# Patient Record
Sex: Female | Born: 1990 | Hispanic: Yes | Marital: Single | Smoking: Never smoker
Health system: Southern US, Community
[De-identification: ages and names within clinical notes are randomized; demographics above are authoritative.]

## PROBLEM LIST (undated history)

## (undated) DIAGNOSIS — Z789 Other specified health status: Secondary | ICD-10-CM

## (undated) HISTORY — PX: NO PAST SURGERIES: SHX2092

---

## 2020-01-07 ENCOUNTER — Ambulatory Visit: Payer: Self-pay

## 2021-01-16 ENCOUNTER — Other Ambulatory Visit: Payer: Self-pay

## 2021-01-16 ENCOUNTER — Emergency Department: Payer: Self-pay

## 2021-01-16 ENCOUNTER — Emergency Department
Admission: EM | Admit: 2021-01-16 | Discharge: 2021-01-16 | Disposition: A | Discharge status: Home / Self Care | Payer: Self-pay | Attending: Emergency Medicine | Admitting: Emergency Medicine

## 2021-01-16 DIAGNOSIS — O418X10 Other specified disorders of amniotic fluid and membranes, first trimester, not applicable or unspecified: Secondary | ICD-10-CM | POA: Insufficient documentation

## 2021-01-16 DIAGNOSIS — Z3A01 Less than 8 weeks gestation of pregnancy: Secondary | ICD-10-CM | POA: Insufficient documentation

## 2021-01-16 DIAGNOSIS — O209 Hemorrhage in early pregnancy, unspecified: Secondary | ICD-10-CM

## 2021-01-16 LAB — CBC WITH DIFFERENTIAL/PLATELET
Abs Immature Granulocytes: 0.02 10*3/uL (ref 0.00–0.07)
Basophils Absolute: 0 10*3/uL (ref 0.0–0.1)
Basophils Relative: 0 %
Eosinophils Absolute: 0.1 10*3/uL (ref 0.0–0.5)
Eosinophils Relative: 1 %
HCT: 38.5 % (ref 36.0–46.0)
Hemoglobin: 13.2 g/dL (ref 12.0–15.0)
Immature Granulocytes: 0 %
Lymphocytes Relative: 30 %
Lymphs Abs: 2.2 10*3/uL (ref 0.7–4.0)
MCH: 28.7 pg (ref 26.0–34.0)
MCHC: 34.3 g/dL (ref 30.0–36.0)
MCV: 83.7 fL (ref 80.0–100.0)
Monocytes Absolute: 0.4 10*3/uL (ref 0.1–1.0)
Monocytes Relative: 6 %
Neutro Abs: 4.6 10*3/uL (ref 1.7–7.7)
Neutrophils Relative %: 63 %
Platelets: 320 10*3/uL (ref 150–400)
RBC: 4.6 MIL/uL (ref 3.87–5.11)
RDW: 14.7 % (ref 11.5–15.5)
WBC: 7.3 10*3/uL (ref 4.0–10.5)
nRBC: 0 % (ref 0.0–0.2)

## 2021-01-16 LAB — URINALYSIS, ROUTINE W REFLEX MICROSCOPIC
Bilirubin Urine: NEGATIVE
Glucose, UA: NEGATIVE mg/dL
Ketones, ur: NEGATIVE mg/dL
Nitrite: NEGATIVE
Protein, ur: NEGATIVE mg/dL
Specific Gravity, Urine: 1.006 (ref 1.005–1.030)
pH: 6 (ref 5.0–8.0)

## 2021-01-16 LAB — COMPREHENSIVE METABOLIC PANEL
ALT: 16 U/L (ref 0–44)
AST: 19 U/L (ref 15–41)
Albumin: 4 g/dL (ref 3.5–5.0)
Alkaline Phosphatase: 54 U/L (ref 38–126)
Anion gap: 6 (ref 5–15)
BUN: 10 mg/dL (ref 6–20)
CO2: 21 mmol/L — ABNORMAL LOW (ref 22–32)
Calcium: 9.3 mg/dL (ref 8.9–10.3)
Chloride: 106 mmol/L (ref 98–111)
Creatinine, Ser: 0.72 mg/dL (ref 0.44–1.00)
GFR, Estimated: >60 mL/min (ref >60)
Glucose, Bld: 108 mg/dL — ABNORMAL HIGH (ref 70–99)
Potassium: 3.2 mmol/L — ABNORMAL LOW (ref 3.5–5.1)
Sodium: 133 mmol/L — ABNORMAL LOW (ref 135–145)
Total Bilirubin: 0.7 mg/dL (ref 0.3–1.2)
Total Protein: 7.5 g/dL (ref 6.5–8.1)

## 2021-01-16 LAB — ABO/RH: ABO/RH(D): O POS

## 2021-01-16 LAB — HCG, QUANTITATIVE, PREGNANCY: hCG, Beta Chain, Quant, S: 81875 m[IU]/mL — ABNORMAL HIGH (ref <5)

## 2021-01-16 LAB — POC URINE PREG, ED: Preg Test, Ur: POSITIVE — AB

## 2021-01-16 MED ORDER — PRENATAL VITAMIN 27-0.8 MG PO TABS: 1.0000 | ORAL_TABLET | Freq: Every day | ORAL | 3 refills | Status: AC

## 2021-01-16 NOTE — ED Notes (Signed)
Dc instructions and scripts reviewed with pt using the interpreter line. No questions or concerns at this time. Pt will follow up with obgyn for ultrasound in 1 week.

## 2021-01-16 NOTE — Discharge Instructions (Addendum)
Llame al nmero del obstetra que aparece arriba para programar una cita para la prxima semana. Si no puede obtener uno, llame a su departamento de Chiropodist.  No levantar objetos pesados, tener relaciones sexuales ni nada en la vagina hasta que lo vea un obstetra/gineclogo.  Recomiendo tomar una vitamina prenatal. Puedes comprar esto sin receta

## 2021-01-16 NOTE — ED Triage Notes (Signed)
Pt c/o light vaginal bleeding with abd cramping since last night,.

## 2021-01-16 NOTE — ED Notes (Signed)
Interpreter requested at this time 

## 2021-01-16 NOTE — ED Notes (Signed)
Pt given urine cup.

## 2021-01-16 NOTE — ED Provider Notes (Signed)
[facility] Emergency Department Provider Note  ____________________________________________   Event Date/Time   First MD Initiated Contact with Patient 01/16/21 [phone]     (approximate)  I have reviewed the triage vital signs and the nursing notes.   HISTORY  Chief Complaint Vaginal Bleeding    HPI Sara Melton is a 30 y.o. female here with vaginal bleeding.  The patient is currently about [redacted] weeks pregnant based on her LMP.  She reports that she went to the restroom today and noticed a small amount of liquid blood in the toilet.  She has since had multiple episodes of bleeding in the toilet when she sits to go to the bathroom.  She has not had any bleeding between episodes going to bathroom.  No blood in her underwear or pads.  She has had some mild lower abdominal cramping.  This feels somewhat like..  Denies any dysuria or frequency.  No flank pain.  No nausea or vomiting.  No chills.  She is a G2, P1 with no complications during her first pregnancy.  She has been taking prenatals.    History reviewed. No pertinent past medical history.  There are no problems to display for this patient.   History reviewed. No pertinent surgical history.  Prior to Admission medications   Medication Sig Start Date End Date Taking? Authorizing Provider  Prenatal Vit-Fe Fumarate-FA (PRENATAL VITAMIN) 27-0.8 MG TABS Take 1 tablet by mouth daily. 01/16/21 04/16/21 Yes Shaune Pollack, MD    Allergies Patient has no known allergies.  No family history on file.  Social History Social History   Tobacco Use   Smoking status: Never   Smokeless tobacco: Never  Substance Use Topics   Alcohol use: Not Currently   Drug use: Not Currently    Review of Systems  Review of Systems  Constitutional:  Negative for fatigue and fever.  HENT:  Negative for congestion and sore throat.   Eyes:  Negative for visual disturbance.  Respiratory:  Negative for cough and  shortness of breath.   Cardiovascular:  Negative for chest pain.  Gastrointestinal:  Positive for abdominal pain. Negative for diarrhea, nausea and vomiting.  Genitourinary:  Positive for vaginal bleeding. Negative for flank pain.  Musculoskeletal:  Negative for back pain and neck pain.  Skin:  Negative for rash and wound.  Neurological:  Negative for weakness.  All other systems reviewed and are negative.   ____________________________________________  PHYSICAL EXAM:      VITAL SIGNS: ED Triage Vitals  Enc Vitals Group     BP 01/16/21 0851 112/66     Pulse Rate 01/16/21 0851 81     Resp 01/16/21 0851 15     Temp 01/16/21 0851 97.9 F (36.6 C)     Temp Source 01/16/21 0851 Oral     SpO2 01/16/21 0851 100 %     Weight --      Height --      Head Circumference --      Peak Flow --      Pain Score 01/16/21 0849 4     Pain Loc --      Pain Edu? --      Excl. in GC? --      Physical Exam Vitals and nursing note reviewed.  Constitutional:      General: She is not in acute distress.    Appearance: She is well-developed.  HENT:     Head: Normocephalic and atraumatic.  Eyes:  Conjunctiva/sclera: Conjunctivae normal.  Cardiovascular:     Rate and Rhythm: Normal rate and regular rhythm.     Heart sounds: Normal heart sounds. No murmur heard.   No friction rub.  Pulmonary:     Effort: Pulmonary effort is normal. No respiratory distress.     Breath sounds: Normal breath sounds. No wheezing or rales.  Abdominal:     General: There is no distension.     Palpations: Abdomen is soft.     Tenderness: There is abdominal tenderness (Minimal, suprapubic).  Musculoskeletal:     Cervical back: Neck supple.  Skin:    General: Skin is warm.     Capillary Refill: Capillary refill takes less than 2 seconds.  Neurological:     Mental Status: She is alert and oriented to person, place, and time.     Motor: No abnormal muscle tone.       ____________________________________________   LABS (all labs ordered are listed, but only abnormal results are displayed)  Labs Reviewed  COMPREHENSIVE METABOLIC PANEL - Abnormal; Notable for the following components:      Result Value   Sodium 133 (*)    Potassium 3.2 (*)    CO2 21 (*)    Glucose, Bld 108 (*)    All other components within normal limits  HCG, QUANTITATIVE, PREGNANCY - Abnormal; Notable for the following components:   hCG, Beta Chain, Quant, S 81,875 (*)    All other components within normal limits  URINALYSIS, ROUTINE W REFLEX MICROSCOPIC - Abnormal; Notable for the following components:   Color, Urine YELLOW (*)    APPearance HAZY (*)    Hgb urine dipstick LARGE (*)    Leukocytes,Ua SMALL (*)    Bacteria, UA FEW (*)    All other components within normal limits  POC URINE PREG, ED - Abnormal; Notable for the following components:   Preg Test, Ur POSITIVE (*)    All other components within normal limits  CBC WITH DIFFERENTIAL/PLATELET  ABO/RH    ____________________________________________  EKG: Single viable intrauterine pregnancy at 5 weeks 5 days, moderate subchorionic hemorrhage ________________________________________  RADIOLOGY All imaging, including plain films, CT scans, and ultrasounds, independently reviewed by me, and interpretations confirmed via formal radiology reads.  ED MD interpretation:   Ultrasound:  Official radiology report(s): US OB LESS THAN 14 WEEKS WITH OB TRANSVAGINAL  Result Date: 01/16/2021 CLINICAL DATA:  Vaginal bleeding.  LMP 11/26/2020 EXAM: OBSTETRIC <14 WK [place] AND TRANSVAGINAL OB US TECHNIQUE: Both transabdominal and transvaginal ultrasound examinations were performed for complete evaluation of the gestation as well as the maternal uterus, adnexal regions, and pelvic cul-de-sac. Transvaginal technique was performed to assess early pregnancy. COMPARISON:  None. FINDINGS: Intrauterine gestational sac: Single Yolk sac:   Visualized. Embryo:  Visualized. Cardiac Activity: Visualized. Heart Rate: 101 bpm CRL:  2 mm   5 w   5 d                  [place] EDC: 09/13/2021 Subchorionic hemorrhage: Moderate subchorionic hemorrhage measuring 3.4 x 2.1 x 1.2 cm Maternal uterus/adnexae: No uterine masses. Corpus luteal cyst noted in the right ovary. Normal left ovary. Trace simple free fluid in the pelvic cul-de-sac. IMPRESSION: 1. Single viable intrauterine pregnancy at 5 weeks 5 days. 2. Moderate subchorionic hemorrhage. 3. Trace simple free fluid in the pelvic cul-de-sac. 4. Normal uterus and ovaries. Electronically Signed   By: Sherron Ales M.D.   On: 01/16/2021 12:32    ____________________________________________  PROCEDURES  Procedure(s) performed (including Critical Care):  Procedures  ____________________________________________  INITIAL IMPRESSION / MDM / ASSESSMENT AND PLAN / ED COURSE  As part of my medical decision making, I reviewed the following data within the electronic MEDICAL RECORD NUMBER Nursing notes reviewed and incorporated, Old chart reviewed, Notes from prior ED visits, and [place] Controlled Substance Database       *Sara Melton was evaluated in Emergency Department on 01/16/2021 for the symptoms described in the history of present illness. She was evaluated in the context of the global COVID-19 pandemic, which necessitated consideration that the patient might be at risk for infection with the SARS-CoV-2 virus that causes COVID-19. Institutional protocols and algorithms that pertain to the evaluation of patients at risk for COVID-19 are in a state of rapid change based on information released by regulatory bodies including the CDC and federal and state organizations. These policies and algorithms were followed during the patient's care in the ED.  Some ED evaluations and interventions may be delayed as a result of limited staffing during the pandemic.*     Medical Decision Making: 30 year old female  here with vaginal bleeding.  Patient is currently in her early first trimester.  hCG 81,000.  ABO shows O+, RhoGAM not indicated.  Hemoglobin is stable.  UA with no hematuria and she denies any urinary symptoms.  Ultrasound obtained, reviewed, does show moderate subchorionic hemorrhage.  She has viable IUP at 5 weeks and 5 days.  No evidence of ectopic.  I discussed the findings with her, including slightly increased risk for miscarriage given moderate size of the subchorionic.  Will advise pelvic rest, prenatals, and refer her to [facility] for follow-up.  An interpreter was used and all questions answered.  Discharged with outpatient follow-up.  ____________________________________________  FINAL CLINICAL IMPRESSION(S) / ED DIAGNOSES  Final diagnoses:  First trimester bleeding  Subchorionic hematoma in first trimester, single or unspecified fetus     MEDICATIONS GIVEN DURING THIS VISIT:  Medications - No data to display   ED Discharge Orders          Ordered    Prenatal Vit-Fe Fumarate-FA (PRENATAL VITAMIN) 27-0.8 MG TABS  Daily        01/16/21 1317             Note:  This document was prepared using Dragon voice recognition software and may include unintentional dictation errors.   Shaune Pollack, MD 01/16/21 [phone]

## 2021-03-24 NOTE — L&D Delivery Note (Signed)
Delivery Note CNM arrived in room at 1030. Pt pushing involuntarily w/ next contraction. Ant li/+1. Pushing well. ROM x 3 hours. At 10:52 AM a viable and healthy, vigorous female was delivered via Vaginal, Spontaneous (Presentation: Left Occiput Anterior).  Shoulders delivered easily. Infant placed skin-to-skin w/ mom. Delayed cord clamping x 1 minute. Cord clamped x 2 and cut by aunt. APGAR: 9, 9; weight  pending. Pitocin bolus started at 1055.     Placenta status: Spontaneous, Intact.  Cord: 3 vessels with the following complications: None.  Cord pH: NA  Anesthesia: None Episiotomy: None Lacerations: 1st degree Suture Repair:  NA. Hemostatic. Est. Blood Loss (mL): 504  Mom to postpartum.  Baby to Couplet care / Skin to Skin. Placenta to: L&D Feeding: Br/Bo Circ: No Contraception: OP Nexplanon  [place] 09/08/2021, 11:30 AM

## 2021-05-09 LAB — OB RESULTS CONSOLE HEPATITIS B SURFACE ANTIGEN: Hepatitis B Surface Ag: NEGATIVE

## 2021-05-09 LAB — OB RESULTS CONSOLE RUBELLA ANTIBODY, IGM: Rubella: IMMUNE

## 2021-05-09 LAB — HEPATITIS C ANTIBODY: HCV Ab: NEGATIVE

## 2021-05-09 LAB — OB RESULTS CONSOLE RPR: RPR: NONREACTIVE

## 2021-05-13 ENCOUNTER — Other Ambulatory Visit: Payer: Self-pay | Admitting: Family

## 2021-05-13 DIAGNOSIS — N6320 Unspecified lump in the left breast, unspecified quadrant: Secondary | ICD-10-CM

## 2021-05-30 ENCOUNTER — Observation Stay
Admission: EM | Admit: 2021-05-30 | Discharge: 2021-05-30 | Disposition: A | Discharge status: Home / Self Care | Payer: BC Managed Care – PPO | Attending: Obstetrics and Gynecology | Admitting: Obstetrics and Gynecology

## 2021-05-30 ENCOUNTER — Other Ambulatory Visit: Payer: Self-pay

## 2021-05-30 DIAGNOSIS — Z3490 Encounter for supervision of normal pregnancy, unspecified, unspecified trimester: Secondary | ICD-10-CM

## 2021-05-30 DIAGNOSIS — R55 Syncope and collapse: Secondary | ICD-10-CM | POA: Insufficient documentation

## 2021-05-30 DIAGNOSIS — Z3A25 25 weeks gestation of pregnancy: Secondary | ICD-10-CM | POA: Insufficient documentation

## 2021-05-30 DIAGNOSIS — O26892 Other specified pregnancy related conditions, second trimester: Principal | ICD-10-CM | POA: Insufficient documentation

## 2021-05-30 MED ORDER — ONDANSETRON 4 MG PO TBDP: 4.0000 mg | ORAL_TABLET | Freq: Once | ORAL | Status: AC

## 2021-05-30 MED ORDER — ACETAMINOPHEN 325 MG PO TABS: 650.0000 mg | ORAL_TABLET | Freq: Four times a day (QID) | ORAL | Status: DC | PRN

## 2021-05-30 MED ORDER — ACETAMINOPHEN 325 MG PO TABS
ORAL_TABLET | ORAL | Status: AC
  Administered 2021-05-30: 04:00:00 650 mg via ORAL
  Filled 2021-05-30: qty 2

## 2021-05-30 MED ORDER — ONDANSETRON 4 MG PO TBDP
ORAL_TABLET | ORAL | Status: AC
  Administered 2021-05-30: 04:00:00 4 mg via ORAL
  Filled 2021-05-30: qty 1

## 2021-05-30 NOTE — OB Triage Note (Signed)
Pt discharged home in stable condition per MD order.  ?

## 2021-05-30 NOTE — Progress Notes (Signed)
Pt states she has not had any cramping since arriving to L&D. ?

## 2021-05-30 NOTE — OB Triage Note (Signed)
Pt is a G2P1 at [redacted]w[redacted]d presenting to L&D triage c/o of passing out at work. Pt also endorses some mild cramping rating it 5/10. Pt denies LOF and vaginal bleeding at this time. Pt endorses spotting on Saturday 05/26/2021. +FM. FHT 140.  ?

## 2021-05-31 ENCOUNTER — Ambulatory Visit
Admission: RE | Admit: 2021-05-31 | Discharge: 2021-05-31 | Disposition: A | Discharge status: Home / Self Care | Payer: No Typology Code available for payment source | Source: Ambulatory Visit | Attending: Family | Admitting: Family

## 2021-05-31 DIAGNOSIS — N6320 Unspecified lump in the left breast, unspecified quadrant: Secondary | ICD-10-CM

## 2021-05-31 DIAGNOSIS — R55 Syncope and collapse: Secondary | ICD-10-CM | POA: Diagnosis present

## 2021-05-31 NOTE — Final Progress Note (Signed)
.  L&D OB Triage Note ? ?Sara Melton is an unassigned 31 y.o. G2P1001 female at [redacted]w[redacted]d, EDD Estimated Date of Delivery: 09/13/21 who presented to triage for complaints of syncopal episode at work.  She reports that she did not sustain abdominal trauma, however did endorse mild cramping 5/10 earlier that day. Denies vaginal bleeding, leaking of fluids or contractions.  Of note, patient reported that she had not eaten much today.  She was evaluated by the nurses with no significant findings.  No cramping noted while in triage. Vital signs stable. An NST was performed and has been reviewed by MD. She was treated with PO hydration and crackers.  ? ?NST INTERPRETATION: ?Indications: patient reassurance and maternal syncopal episode ? ?Mode: External ?Baseline Rate (A): 145 bpm ?Variability: Moderate ?Accelerations: 10 x 10 ?Decelerations: None ?  ?  ?Contraction Frequency (min): None noted ? ?Impression: reactive ? ? ? ?Plan: NST performed was reviewed and was found to be reactive. She was discharged home with syncopal precautions.  Encouraged to regularly consume meals, and encouraged snacking and maintaining adequate hydration. Continue routine prenatal care. Follow up with her OB/GYN as previously scheduled.  ? ? ? ?Hildred Laser, MD ?[facility] ? ?

## 2021-07-04 LAB — OB RESULTS CONSOLE HIV ANTIBODY (ROUTINE TESTING): HIV: NONREACTIVE

## 2021-08-20 LAB — OB RESULTS CONSOLE GC/CHLAMYDIA
Chlamydia: NEGATIVE
Neisseria Gonorrhea: NEGATIVE

## 2021-08-20 LAB — OB RESULTS CONSOLE GBS: GBS: NEGATIVE

## 2021-09-08 ENCOUNTER — Other Ambulatory Visit: Payer: Self-pay

## 2021-09-08 ENCOUNTER — Encounter (HOSPITAL_COMMUNITY): Payer: Self-pay | Admitting: Obstetrics & Gynecology

## 2021-09-08 ENCOUNTER — Inpatient Hospital Stay (HOSPITAL_COMMUNITY)
Admission: AD | Admit: 2021-09-08 | Discharge: 2021-09-09 | DRG: 807 | Disposition: A | Discharge status: Home / Self Care | Payer: BC Managed Care – PPO | Attending: Obstetrics & Gynecology | Admitting: Obstetrics & Gynecology

## 2021-09-08 DIAGNOSIS — O26893 Other specified pregnancy related conditions, third trimester: Secondary | ICD-10-CM | POA: Diagnosis present

## 2021-09-08 DIAGNOSIS — Z3A39 39 weeks gestation of pregnancy: Secondary | ICD-10-CM | POA: Diagnosis not present

## 2021-09-08 HISTORY — DX: Other specified health status: Z78.9

## 2021-09-08 LAB — CBC
HCT: 34.9 % — ABNORMAL LOW (ref 36.0–46.0)
Hemoglobin: 11.2 g/dL — ABNORMAL LOW (ref 12.0–15.0)
MCH: 26.7 pg (ref 26.0–34.0)
MCHC: 32.1 g/dL (ref 30.0–36.0)
MCV: 83.3 fL (ref 80.0–100.0)
Platelets: 219 10*3/uL (ref 150–400)
RBC: 4.19 MIL/uL (ref 3.87–5.11)
RDW: 16 % — ABNORMAL HIGH (ref 11.5–15.5)
WBC: 9.4 10*3/uL (ref 4.0–10.5)
nRBC: 0.4 % — ABNORMAL HIGH (ref 0.0–0.2)

## 2021-09-08 LAB — COMPREHENSIVE METABOLIC PANEL
ALT: 16 U/L (ref 0–44)
AST: 22 U/L (ref 15–41)
Albumin: 2.8 g/dL — ABNORMAL LOW (ref 3.5–5.0)
Alkaline Phosphatase: 268 U/L — ABNORMAL HIGH (ref 38–126)
Anion gap: 8 (ref 5–15)
BUN: 12 mg/dL (ref 6–20)
CO2: 16 mmol/L — ABNORMAL LOW (ref 22–32)
Calcium: 8.9 mg/dL (ref 8.9–10.3)
Chloride: 111 mmol/L (ref 98–111)
Creatinine, Ser: 0.71 mg/dL (ref 0.44–1.00)
GFR, Estimated: >60 mL/min (ref >60)
Glucose, Bld: 87 mg/dL (ref 70–99)
Potassium: 4.1 mmol/L (ref 3.5–5.1)
Sodium: 135 mmol/L (ref 135–145)
Total Bilirubin: 0.3 mg/dL (ref 0.3–1.2)
Total Protein: 6.9 g/dL (ref 6.5–8.1)

## 2021-09-08 LAB — TYPE AND SCREEN
ABO/RH(D): O POS
Antibody Screen: NEGATIVE

## 2021-09-08 LAB — RPR: RPR Ser Ql: NONREACTIVE

## 2021-09-08 MED ORDER — ACETAMINOPHEN 325 MG PO TABS
650.0000 mg | ORAL_TABLET | ORAL | Status: DC | PRN
  Administered 2021-09-08 – 2021-09-09 (×2): 650 mg via ORAL
  Filled 2021-09-08 (×2): qty 2

## 2021-09-08 MED ORDER — OXYTOCIN BOLUS FROM INFUSION
333.0000 mL | Freq: Once | INTRAVENOUS | Status: AC
  Administered 2021-09-08: 333 mL via INTRAVENOUS

## 2021-09-08 MED ORDER — SIMETHICONE 80 MG PO CHEW: 80.0000 mg | CHEWABLE_TABLET | ORAL | Status: DC | PRN

## 2021-09-08 MED ORDER — PRENATAL MULTIVITAMIN CH
1.0000 | ORAL_TABLET | Freq: Every day | ORAL | Status: DC
  Administered 2021-09-08 – 2021-09-09 (×2): 1 via ORAL
  Filled 2021-09-08 (×2): qty 1

## 2021-09-08 MED ORDER — WITCH HAZEL-GLYCERIN EX PADS: 1.0000 | MEDICATED_PAD | CUTANEOUS | Status: DC | PRN

## 2021-09-08 MED ORDER — IBUPROFEN 600 MG PO TABS
600.0000 mg | ORAL_TABLET | Freq: Four times a day (QID) | ORAL | Status: DC
  Administered 2021-09-08 – 2021-09-09 (×5): 600 mg via ORAL
  Filled 2021-09-08 (×5): qty 1

## 2021-09-08 MED ORDER — OXYTOCIN-SODIUM CHLORIDE 30-0.9 UT/500ML-% IV SOLN
2.5000 [IU]/h | INTRAVENOUS | Status: DC
  Administered 2021-09-08: 2.5 [IU]/h via INTRAVENOUS
  Filled 2021-09-08: qty 500

## 2021-09-08 MED ORDER — LACTATED RINGERS IV SOLN: INTRAVENOUS | Status: DC

## 2021-09-08 MED ORDER — BENZOCAINE-MENTHOL 20-0.5 % EX AERO
1.0000 | INHALATION_SPRAY | CUTANEOUS | Status: DC | PRN
  Administered 2021-09-08: 1 via TOPICAL
  Filled 2021-09-08 (×2): qty 56

## 2021-09-08 MED ORDER — ONDANSETRON HCL 4 MG/2ML IJ SOLN: 4.0000 mg | Freq: Four times a day (QID) | INTRAMUSCULAR | Status: DC | PRN

## 2021-09-08 MED ORDER — LACTATED RINGERS IV SOLN: 500.0000 mL | INTRAVENOUS | Status: DC | PRN

## 2021-09-08 MED ORDER — TETANUS-DIPHTH-ACELL PERTUSSIS 5-2.5-18.5 LF-MCG/0.5 IM SUSY: 0.5000 mL | PREFILLED_SYRINGE | Freq: Once | INTRAMUSCULAR | Status: DC

## 2021-09-08 MED ORDER — SOD CITRATE-CITRIC ACID 500-334 MG/5ML PO SOLN: 30.0000 mL | ORAL | Status: DC | PRN

## 2021-09-08 MED ORDER — OXYCODONE-ACETAMINOPHEN 5-325 MG PO TABS
1.0000 | ORAL_TABLET | ORAL | Status: DC | PRN
  Administered 2021-09-08: 1 via ORAL
  Filled 2021-09-08: qty 1

## 2021-09-08 MED ORDER — ONDANSETRON HCL 4 MG PO TABS: 4.0000 mg | ORAL_TABLET | ORAL | Status: DC | PRN

## 2021-09-08 MED ORDER — MEASLES, MUMPS & RUBELLA VAC IJ SOLR: 0.5000 mL | Freq: Once | INTRAMUSCULAR | Status: DC

## 2021-09-08 MED ORDER — OXYCODONE-ACETAMINOPHEN 5-325 MG PO TABS: 2.0000 | ORAL_TABLET | ORAL | Status: DC | PRN

## 2021-09-08 MED ORDER — MAGNESIUM HYDROXIDE 400 MG/5ML PO SUSP: 30.0000 mL | ORAL | Status: DC | PRN

## 2021-09-08 MED ORDER — LIDOCAINE HCL (PF) 1 % IJ SOLN: 30.0000 mL | INTRAMUSCULAR | Status: DC | PRN

## 2021-09-08 MED ORDER — COCONUT OIL OIL: 1.0000 | TOPICAL_OIL | Status: DC | PRN

## 2021-09-08 MED ORDER — OXYCODONE-ACETAMINOPHEN 5-325 MG PO TABS: 1.0000 | ORAL_TABLET | ORAL | Status: DC | PRN

## 2021-09-08 MED ORDER — ACETAMINOPHEN 325 MG PO TABS: 650.0000 mg | ORAL_TABLET | ORAL | Status: DC | PRN

## 2021-09-08 MED ORDER — DIBUCAINE (PERIANAL) 1 % EX OINT: 1.0000 | TOPICAL_OINTMENT | CUTANEOUS | Status: DC | PRN

## 2021-09-08 MED ORDER — ONDANSETRON HCL 4 MG/2ML IJ SOLN: 4.0000 mg | INTRAMUSCULAR | Status: DC | PRN

## 2021-09-08 MED ORDER — DIPHENHYDRAMINE HCL 25 MG PO CAPS: 25.0000 mg | ORAL_CAPSULE | Freq: Four times a day (QID) | ORAL | Status: DC | PRN

## 2021-09-08 NOTE — Lactation Note (Signed)
This note was copied from a baby's chart. Lactation Consultation Note  Patient Name: Sara Melton [id number] Date: 09/08/2021 Reason for consult: Initial assessment;Term Age:30 hours   P2 mother whose infant is now 18 hours old.  This is a term baby at 39+2 weeks.  Mother's current feeding preference is breast/formula. Mother breast fed her first child (now 37 years old) for 2 years.  In house Spanish interpreter, Racquel, used for interpretation.    Baby "Sara Melton" was swaddled and asleep next to mother when I arrived.  Mother reported having a good feeding after birth.  Breast feeding basics reviewed.  Encouraged lots of STS, breast massage and hand expression.  Suggested mother feed back any expressed drops she obtains via finger/spoon.  Mother may call for latch assistance as desired.  She will feed 8-12 times/24 hours or sooner if "Sara Melton" shows cues.  Mother had no questions/concerns at this time.  Father present and asleep on the couch.   Maternal Data Has patient been taught Hand Expression?: Yes Does the patient have breastfeeding experience prior to this delivery?: Yes How long did the patient breastfeed?: 2 years  Feeding Mother's Current Feeding Choice: Breast Milk and Formula  LATCH Score                    Lactation Tools Discussed/Used    Interventions Interventions: Breast feeding basics reviewed;Education;LC Services brochure (Spanish brochure)  Discharge WIC Program: Yes  Consult Status Consult Status: Follow-up Date: 09/09/21 Follow-up type: In-patient    Dora Sims 09/08/2021, 4:24 PM

## 2021-09-08 NOTE — Lactation Note (Signed)
This note was copied from a baby's chart. Lactation Consultation Note  Patient Name: Sara Melton [id number] Date: 09/08/2021 Reason for consult: L&D Initial assessment;Term;Breastfeeding assistance;Other (Comment) (LC LD visit at PP, [facility] spanish interpreter [place] present. Dad holding baby and LD RN just bringing baby back from the bathroom. LC offered to assist to latch / mom receptive, Baby latched on/ off at 1st,then fed 5 mins. relatched, still feeding.) Age: 29 mins PP / P2 ( exp BF 2 years )  Baby still rooting after release and latched back with depth, wide open mouth, flanged lips and swallows with breast compressions. Still feeding > 5 mins ( 2nd latch ) per mom comfortable.  Maternal Data Does the patient have breastfeeding experience prior to this delivery?: Yes How long did the patient breastfeed?: per mom 2 years with her 1st baby ( now 34 years old )  Feeding Mother's Current Feeding Choice: Breast Milk and Formula  LATCH Score Latch: Repeated attempts needed to sustain latch, nipple held in mouth throughout feeding, stimulation needed to elicit sucking reflex.  Audible Swallowing: A few with stimulation  Type of Nipple: Everted at rest and after stimulation  Comfort (Breast/Nipple): Soft / non-tender  Hold (Positioning): Assistance needed to correctly position infant at breast and maintain latch.  LATCH Score: 7   Lactation Tools Discussed/Used    Interventions Interventions: Breast feeding basics reviewed;Assisted with latch;Skin to skin;Breast compression;Adjust position;Support pillows;Education  Discharge    Consult Status Consult Status: Follow-up from L&D Date: 09/08/21 Follow-up type: In-patient    Matilde Sprang Yvonnia Tango 09/08/2021, 11:57 AM

## 2021-09-08 NOTE — MAU Note (Signed)
Vertex presentation confirmed via bs [place] by cnm leftwich kirby

## 2021-09-08 NOTE — Discharge Summary (Signed)
Postpartum Discharge Summary  Date of Service updated***     Patient Name: Sara Melton DOB: October 15, 1990 MRN: [id number]  Date of admission: 09/08/2021 Delivery date:09/08/2021  Delivering provider: Patriciaann Clan  Date of discharge: 09/08/2021  Admitting diagnosis: Indication for care or intervention in labor or delivery [O75.9] Intrauterine pregnancy: [redacted]w[redacted]d     Secondary diagnosis:  Principal Problem:   Vaginal delivery Active Problems:   Indication for care or intervention in labor or delivery  Additional problems: ***    Discharge diagnosis: Term Pregnancy Delivered                                              Post partum procedures:{Postpartum procedures:23558} Augmentation:  None Complications: None  Hospital course: Onset of Labor With Vaginal Delivery      31 y.o. yo G2P1001 at [redacted]w[redacted]d was admitted in Active Labor on 09/08/2021. Patient had an uncomplicated labor course as follows:  Membrane Rupture Time/Date: 7:50 AM ,09/08/2021   Delivery Method:Vaginal, Spontaneous  Episiotomy: None  Lacerations:  1st degree  Patient had an uncomplicated postpartum course.  She is ambulating, tolerating a regular diet, passing flatus, and urinating well. Patient is discharged home in stable condition on 09/08/21.  Newborn Data: Birth date:09/08/2021  Birth time:10:52 AM  Gender:Female  Living status:Living  Apgars:9 ,9  Weight:   Magnesium Sulfate received: No BMZ received: No Rhophylac:N/A MMR:N/A T-DaP:Given prenatally Flu: {FBP:10258} Transfusion:{Transfusion received:30440034}  Physical exam  Vitals:   09/08/21 1100 09/08/21 1115 09/08/21 1130 09/08/21 1145  BP: 116/66 120/69 127/78 (!) 115/99  Pulse: 72 71 70 72  Resp: $Remo'18 18 16 16  'roBiC$ Temp:  98 F (36.7 C)    TempSrc:  Oral    SpO2:      Weight:      Height:       General: {Exam; general:21111117} Lochia: {Desc; appropriate/inappropriate:30686::"appropriate"} Uterine Fundus: {Desc;  firm/soft:30687} Incision: {Exam; incision:21111123} DVT Evaluation: {Exam; dvt:2111122} Labs: Lab Results  Component Value Date   WBC 9.4 09/08/2021   HGB 11.2 (L) 09/08/2021   HCT 34.9 (L) 09/08/2021   MCV 83.3 09/08/2021   PLT 219 09/08/2021      Latest Ref Rng & Units 09/08/2021    7:40 AM  CMP  Glucose 70 - 99 mg/dL 87   BUN 6 - 20 mg/dL 12   Creatinine 0.44 - 1.00 mg/dL 0.71   Sodium 135 - 145 mmol/L 135   Potassium 3.5 - 5.1 mmol/L 4.1   Chloride 98 - 111 mmol/L 111   CO2 22 - 32 mmol/L 16   Calcium 8.9 - 10.3 mg/dL 8.9   Total Protein 6.5 - 8.1 g/dL 6.9   Total Bilirubin 0.3 - 1.2 mg/dL 0.3   Alkaline Phos 38 - 126 U/L 268   AST 15 - 41 U/L 22   ALT 0 - 44 U/L 16    Edinburgh Score:     No data to display           After visit meds:  Allergies as of 09/08/2021   No Known Allergies   Med Rec must be completed prior to using this [facility]***        Discharge home in stable condition Infant Feeding: Bottle and Breast Infant Disposition:{CHL IP OB HOME WITH NIDPOE:42353} Discharge instruction: per After Visit Summary and Postpartum booklet. Activity: Advance as tolerated.  Pelvic rest for 6 weeks.  Diet: {OB EXBM:84132440} Future Appointments:No future appointments. Follow up Visit:   Please schedule this patient for a In person postpartum visit in 6 weeks with the following provider: Any provider. Additional Postpartum F/U:{PP Procedure:23957}  Low risk pregnancy complicated by:  Nothing Delivery mode:  Vaginal, Spontaneous  Anticipated Birth Control:  Nexplanon OP at HD   09/08/2021 Manya Silvas, [place]ain

## 2021-09-08 NOTE — H&P (Signed)
Sara Melton is a 31 y.o. female G2P1001 with IUP at [redacted]w[redacted]d by early [place] presenting for SOL.  She reports positive fetal movement. She denies leakage of fluid or vaginal bleeding.  Prenatal History/Complications: PNC at [facility] Pregnancy complications:  - Past Medical History: Past Medical History:  Diagnosis Date   Medical history non-contributory     Past Surgical History: Past Surgical History:  Procedure Laterality Date   NO PAST SURGERIES      Obstetrical History: OB History     Gravida  2   Para  1   Term  1   Preterm      AB      Living  1      SAB      IAB      Ectopic      Multiple      Live Births  1          Social History: Social History   Socioeconomic History   Marital status: Single    Spouse name: Not on file   Number of children: 1   Years of education: Not on file   Highest education level: Not on file  Occupational History   Not on file  Tobacco Use   Smoking status: Never   Smokeless tobacco: Never  Vaping Use   Vaping Use: Never used  Substance and Sexual Activity   Alcohol use: Not Currently   Drug use: Not Currently   Sexual activity: Yes  Other Topics Concern   Not on file  Social History Narrative   Not on file   Social Determinants of Health   Financial Resource Strain: Not on file  Food Insecurity: Not on file  Transportation Needs: Not on file  Physical Activity: Not on file  Stress: Not on file  Social Connections: Not on file    Family History: History reviewed. No pertinent family history.  Allergies: No Known Allergies  Medications Prior to Admission  Medication Sig Dispense Refill Last Dose   Prenatal Vit-Fe Fumarate-FA (PRENATAL MULTIVITAMIN) TABS tablet Take 1 tablet by mouth daily at 12 noon.   09/07/2021   Review of Systems: Pertinent positives as noted in HPI, otherwise negative.  Blood pressure 124/90, pulse 63, temperature 98.4 F (36.9 C), temperature source Oral, resp. rate 19,  height 5' 2.21" (1.58 m), weight 81.6 kg, last menstrual period 12/07/2020, SpO2 99 %.  General appearance: alert, cooperative, and no distress Lungs: normal respiratory effort Heart: normal rate, warm and well perfused  Abdomen: soft, non-tender, gravid  Extremities: no LE edema or calf tenderness to palpation   Presentation: Cephalic per RN Fetal monitoring: Baseline 140 bpm, moderate variability, + accels, no decels  Uterine activity: Every 1-6 minutes  Dilation: 6.5 Effacement (%): 60 Station: -2 Exam by:: Marvel Plan RN  Prenatal labs: ABO, Rh: --/--/O POS Performed at [facility], [address] [place], [place] [address]  [phone]) Antibody: Negative  Rubella: Immune  RPR: Non-reactive  HBsAg: Non-reactive  HIV: Non-reactive GBS: Negative  1 hr Glucola normal  Genetic screening - LR NIPS, Horizon negative  Anatomy US normal   Prenatal Transfer Tool  Maternal Diabetes: No Genetic Screening: Normal Maternal Ultrasounds/Referrals: Normal Fetal Ultrasounds or other Referrals: None Maternal Substance Abuse: No Significant Maternal Medications:  None Significant Maternal Lab Results: Group B Strep negative  No results found for this or any previous visit (from the past 24 hour(s)).  Assessment: Sara Melton is a 31 y.o. G2P1001  with an IUP at [redacted]w[redacted]d presenting for SOL.   Plan: #Labor: Expectant management #Pain:  Per request #FWB Cat 1 #ID: GBS: Negative  #MOF:  Breast/Bottle #MOC: Nexplanon inpatient #Circ: None  Worthy Rancher 09/08/2021, 7:42 AM

## 2021-09-08 NOTE — MAU Note (Signed)
Sara Melton is a 31 y.o. at [redacted]w[redacted]d here in MAU reporting: ctx that started around 0200 about 8 minutes apart. Reports bloody discharge yesterday, but denies any current VB. Denies LOF. +FM. No recent SVE.   Onset of complaint: 0200 Pain score: 7 Vitals:   09/08/21 0615  BP: 117/85  Pulse: 72  Resp: 19  Temp: 98.4 F (36.9 C)  SpO2: 98%    FHT:149 Lab orders placed from triage: none

## 2021-09-09 ENCOUNTER — Encounter (HOSPITAL_COMMUNITY): Payer: Self-pay | Admitting: Student

## 2021-09-09 LAB — CBC
HCT: 28.4 % — ABNORMAL LOW (ref 36.0–46.0)
Hemoglobin: 9.3 g/dL — ABNORMAL LOW (ref 12.0–15.0)
MCH: 27.2 pg (ref 26.0–34.0)
MCHC: 32.7 g/dL (ref 30.0–36.0)
MCV: 83 fL (ref 80.0–100.0)
Platelets: 202 10*3/uL (ref 150–400)
RBC: 3.42 MIL/uL — ABNORMAL LOW (ref 3.87–5.11)
RDW: 16 % — ABNORMAL HIGH (ref 11.5–15.5)
WBC: 15.1 10*3/uL — ABNORMAL HIGH (ref 4.0–10.5)
nRBC: 0 % (ref 0.0–0.2)

## 2021-09-09 LAB — BIRTH TISSUE RECOVERY COLLECTION (PLACENTA DONATION)

## 2021-09-09 MED ORDER — IBUPROFEN 600 MG PO TABS: 600.0000 mg | ORAL_TABLET | Freq: Four times a day (QID) | ORAL | 0 refills | Status: AC

## 2021-09-09 MED ORDER — FERROUS SULFATE 325 (65 FE) MG PO TBEC: 325.0000 mg | DELAYED_RELEASE_TABLET | ORAL | 2 refills | Status: AC

## 2021-09-09 MED ORDER — ACETAMINOPHEN 325 MG PO TABS: 650.0000 mg | ORAL_TABLET | ORAL | 0 refills | Status: AC | PRN

## 2021-09-17 ENCOUNTER — Telehealth (HOSPITAL_COMMUNITY): Payer: Self-pay

## 2021-09-17 NOTE — Telephone Encounter (Signed)
No answer. Left message to return nurse call.  Marcelino Duster [facility] 06/27//2023,1549

## 2022-10-15 ENCOUNTER — Emergency Department
Admission: EM | Admit: 2022-10-15 | Discharge: 2022-10-15 | Disposition: A | Discharge status: Home / Self Care | Payer: No Typology Code available for payment source | Attending: Emergency Medicine | Admitting: Emergency Medicine

## 2022-10-15 ENCOUNTER — Other Ambulatory Visit: Payer: Self-pay

## 2022-10-15 DIAGNOSIS — M545 Low back pain, unspecified: Secondary | ICD-10-CM | POA: Insufficient documentation

## 2022-10-15 DIAGNOSIS — E876 Hypokalemia: Secondary | ICD-10-CM | POA: Insufficient documentation

## 2022-10-15 DIAGNOSIS — Z1152 Encounter for screening for COVID-19: Secondary | ICD-10-CM | POA: Insufficient documentation

## 2022-10-15 LAB — CBC WITH DIFFERENTIAL/PLATELET
Abs Immature Granulocytes: 0.03 10*3/uL (ref 0.00–0.07)
Basophils Absolute: 0 10*3/uL (ref 0.0–0.1)
Basophils Relative: 0 %
Eosinophils Absolute: 0 10*3/uL (ref 0.0–0.5)
Eosinophils Relative: 0 %
HCT: 28.8 % — ABNORMAL LOW (ref 36.0–46.0)
Hemoglobin: 9.3 g/dL — ABNORMAL LOW (ref 12.0–15.0)
Immature Granulocytes: 0 %
Lymphocytes Relative: 21 %
Lymphs Abs: 2.2 10*3/uL (ref 0.7–4.0)
MCH: 24.4 pg — ABNORMAL LOW (ref 26.0–34.0)
MCHC: 32.3 g/dL (ref 30.0–36.0)
MCV: 75.6 fL — ABNORMAL LOW (ref 80.0–100.0)
Monocytes Absolute: 1 10*3/uL (ref 0.1–1.0)
Monocytes Relative: 10 %
Neutro Abs: 7 10*3/uL (ref 1.7–7.7)
Neutrophils Relative %: 69 %
Platelets: 272 10*3/uL (ref 150–400)
RBC: 3.81 MIL/uL — ABNORMAL LOW (ref 3.87–5.11)
RDW: 16 % — ABNORMAL HIGH (ref 11.5–15.5)
WBC: 10.2 10*3/uL (ref 4.0–10.5)
nRBC: 0 % (ref 0.0–0.2)

## 2022-10-15 LAB — BASIC METABOLIC PANEL
Anion gap: 9 (ref 5–15)
BUN: 11 mg/dL (ref 6–20)
CO2: 20 mmol/L — ABNORMAL LOW (ref 22–32)
Calcium: 8.3 mg/dL — ABNORMAL LOW (ref 8.9–10.3)
Chloride: 106 mmol/L (ref 98–111)
Creatinine, Ser: 0.72 mg/dL (ref 0.44–1.00)
GFR, Estimated: >60 mL/min (ref >60)
Glucose, Bld: 101 mg/dL — ABNORMAL HIGH (ref 70–99)
Potassium: 2.9 mmol/L — ABNORMAL LOW (ref 3.5–5.1)
Sodium: 135 mmol/L (ref 135–145)

## 2022-10-15 LAB — URINALYSIS, ROUTINE W REFLEX MICROSCOPIC
Bilirubin Urine: NEGATIVE
Glucose, UA: NEGATIVE mg/dL
Ketones, ur: 5 mg/dL — AB
Leukocytes,Ua: NEGATIVE
Nitrite: NEGATIVE
Protein, ur: NEGATIVE mg/dL
Specific Gravity, Urine: 1.011 (ref 1.005–1.030)
pH: 6 (ref 5.0–8.0)

## 2022-10-15 LAB — POC URINE PREG, ED: Preg Test, Ur: NEGATIVE

## 2022-10-15 LAB — MAGNESIUM: Magnesium: 1.9 mg/dL (ref 1.7–2.4)

## 2022-10-15 LAB — SARS CORONAVIRUS 2 BY RT PCR: SARS Coronavirus 2 by RT PCR: NEGATIVE

## 2022-10-15 MED ORDER — KETOROLAC TROMETHAMINE 30 MG/ML IJ SOLN
30.0000 mg | Freq: Once | INTRAMUSCULAR | Status: AC
  Administered 2022-10-15: 30 mg via INTRAMUSCULAR
  Filled 2022-10-15: qty 1

## 2022-10-15 MED ORDER — POTASSIUM CHLORIDE CRYS ER 20 MEQ PO TBCR
40.0000 meq | EXTENDED_RELEASE_TABLET | Freq: Once | ORAL | Status: AC
  Administered 2022-10-15: 40 meq via ORAL
  Filled 2022-10-15: qty 2

## 2022-10-15 MED ORDER — ACETAMINOPHEN 500 MG PO TABS
1000.0000 mg | ORAL_TABLET | Freq: Once | ORAL | Status: AC
  Administered 2022-10-15: 1000 mg via ORAL
  Filled 2022-10-15: qty 2

## 2022-10-15 NOTE — ED Provider Notes (Signed)
[facility] Provider Note    Event Date/Time   First MD Initiated Contact with Patient 10/15/22 [phone]     (approximate)   History   Flank Pain   HPI  Sara Melton is a 32 y.o. female who presents to the ED for evaluation of Flank Pain   Patient presents to the ED for evaluation of acute on chronic, 1 month plus, left-sided lumbar pain.  No original trauma, falls or injuries.  Also reports subjective chills in the past couple days and concern for possible fever.  No documented fevers. Alongside the chills, she reports a mild frontal headache without vision changes, falls or syncope.  She had a couple episodes of diarrhea in the past few days.  Voiding somewhat more frequently but no dysuria or hematuria.   Physical Exam   Triage Vital Signs: ED Triage Vitals  Encounter Vitals Group     BP 10/15/22 0336 98/63     Systolic BP Percentile --      Diastolic BP Percentile --      Pulse Rate 10/15/22 0336 88     Resp 10/15/22 0336 18     Temp 10/15/22 0336 98.4 F (36.9 C)     Temp Source 10/15/22 0336 Oral     SpO2 10/15/22 0336 100 %     Weight 10/15/22 0341 167 lb (75.8 kg)     Height --      Head Circumference --      Peak Flow --      Pain Score 10/15/22 0340 8     Pain Loc --      Pain Education --      Exclude from Growth Chart --     Most recent vital signs: Vitals:   10/15/22 0336 10/15/22 0610  BP: 98/63 (!) 95/53  Pulse: 88 70  Resp: 18 17  Temp: 98.4 F (36.9 C)   SpO2: 100% 97%    General: Awake, no distress.  CV:  Good peripheral perfusion.  Resp:  Normal effort.  Abd:  No distention.  Soft and benign throughout MSK:  No deformity noted.  Left-sided paraspinal lumbar tenderness without overlying skin changes or signs of trauma.  No midline spinal tenderness. Neuro:  No focal deficits appreciated. Cranial nerves II through XII intact 5/5 strength and sensation in all 4 extremities Other:     ED Results /  Procedures / Treatments   Labs (all labs ordered are listed, but only abnormal results are displayed) Labs Reviewed  CBC WITH DIFFERENTIAL/PLATELET - Abnormal; Notable for the following components:      Result Value   RBC 3.81 (*)    Hemoglobin 9.3 (*)    HCT 28.8 (*)    MCV 75.6 (*)    MCH 24.4 (*)    RDW 16.0 (*)    All other components within normal limits  BASIC METABOLIC PANEL - Abnormal; Notable for the following components:   Potassium 2.9 (*)    CO2 20 (*)    Glucose, Bld 101 (*)    Calcium 8.3 (*)    All other components within normal limits  URINALYSIS, ROUTINE W REFLEX MICROSCOPIC - Abnormal; Notable for the following components:   Color, Urine YELLOW (*)    APPearance CLEAR (*)    Hgb urine dipstick SMALL (*)    Ketones, ur 5 (*)    Bacteria, UA MANY (*)    All other components within normal limits  SARS CORONAVIRUS 2 BY  RT PCR  MAGNESIUM  POC URINE PREG, ED    EKG   RADIOLOGY   Official radiology report(s): No results found.  PROCEDURES and INTERVENTIONS:  Procedures  Medications  potassium chloride SA (KLOR-CON M) CR tablet 40 mEq (40 mEq Oral Given 10/15/22 0505)  ketorolac (TORADOL) 30 MG/ML injection 30 mg (30 mg Intramuscular Given 10/15/22 0507)  acetaminophen (TYLENOL) tablet 1,000 mg (1,000 mg Oral Given 10/15/22 0505)     IMPRESSION / MDM / ASSESSMENT AND PLAN / ED COURSE  I reviewed the triage vital signs and the nursing notes.  Differential diagnosis includes, but is not limited to, UTI, sepsis, viral syndrome, muscular spasm  {Patient presents with symptoms of an acute illness or injury that is potentially life-threatening.  Patient presents with constellation of symptoms of possible viral etiology but ultimately suitable for outpatient management.  Looks well.  No neurologic deficits or stigmata of cauda equina.  Benign abdomen and reassuring workup with no leukocytosis.  Oncocytic anemia is noted and likely chronic.  Small ketones in  the urine are suggestive of dehydration but no infectious features.  Hypokalemia is replaced orally, normal magnesium.  Resolving symptoms with Toradol.  Discussed expectant management and return precautions.  Clinical Course as of 10/15/22 0634  Wed Oct 15, 2022  0630 Reassessed with the interpreter.  Feeling better.  We discussed workup overall, hypokalemia, return precautions.  Answered questions. [DS]    Clinical Course User Index [DS] Delton Prairie, MD     FINAL CLINICAL IMPRESSION(S) / ED DIAGNOSES   Final diagnoses:  Left lumbar pain  Hypokalemia     Rx / DC Orders   ED Discharge Orders     None        Note:  This document was prepared using Dragon voice recognition software and may include unintentional dictation errors.   Delton Prairie, MD 10/15/22 [phone]

## 2022-10-15 NOTE — Discharge Instructions (Addendum)
Please take Tylenol and ibuprofen/Advil for your pain.  It is safe to take them together, or to alternate them every few hours.  Take up to 1000mg of Tylenol at a time, up to 4 times per day.  Do not take more than 4000 mg of Tylenol in 24 hours.  For ibuprofen, take 400-600 mg, 3 - 4 times per day.  

## 2022-10-15 NOTE — ED Triage Notes (Signed)
Pt presents to ER with c/o HA, fever, and left lower back pain since yesterday.  Pt reports she did not check her temp, but was sweating a lot.  Pt reports nausea and diarrhea at home. Pt reports she has been urinating more frequently than normal.  Pt is otherwise A&O x4 and in NAD at this time.

## 2023-02-01 IMAGING — US US BREAST*L* LIMITED INC AXILLA
1 series · 14 of 18 positions shown · non-contrast
Comparison: None.

CLINICAL DATA: 30-year-old female with palpable mass in the UPPER
LEFT breast identified on self-examination and states this mass is
pregnant.

EXAM:
ULTRASOUND OF THE LEFT BREAST

[Series 1: us breast*left* limited inc axilla · 0.07mm/px · 18 acquisitions, 14 frames shown]
[im 1/18]
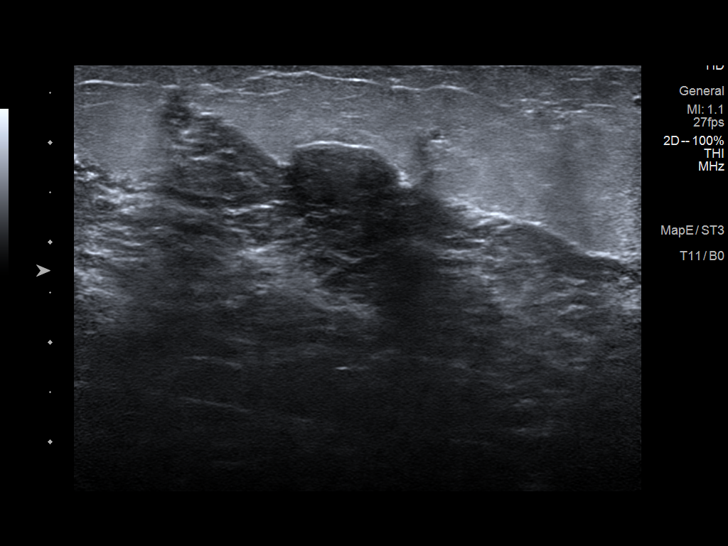
[im 2/18]
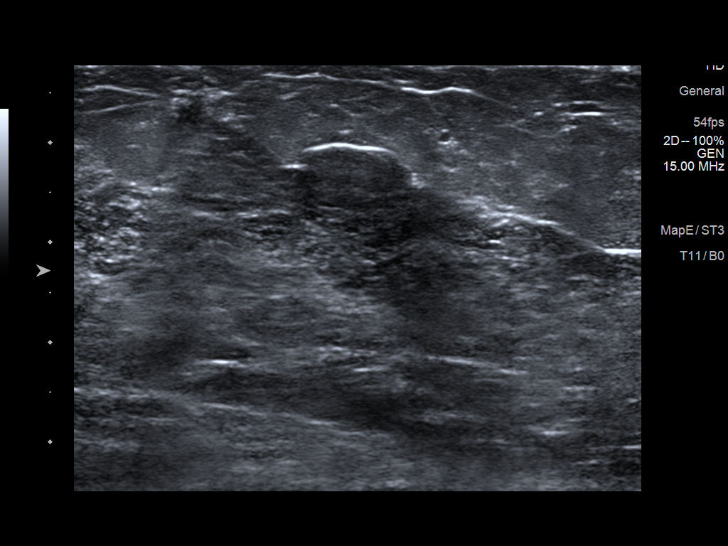
[im 4/18]
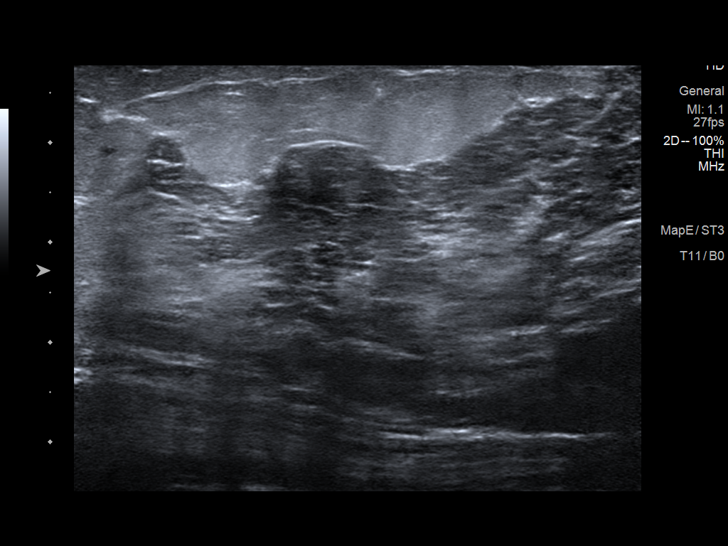
[im 5/18]
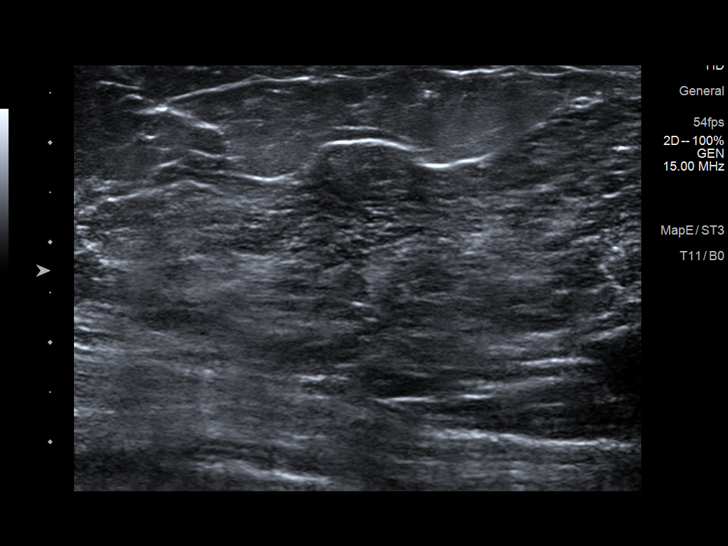
[im 6/18]
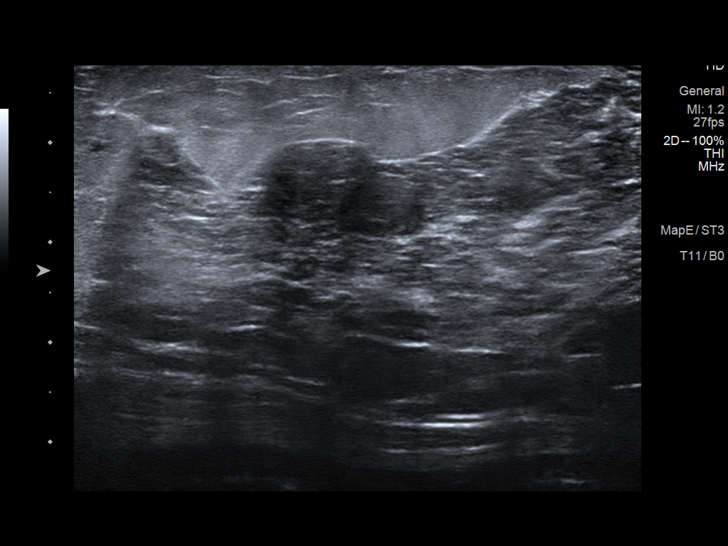
[im 8/18]
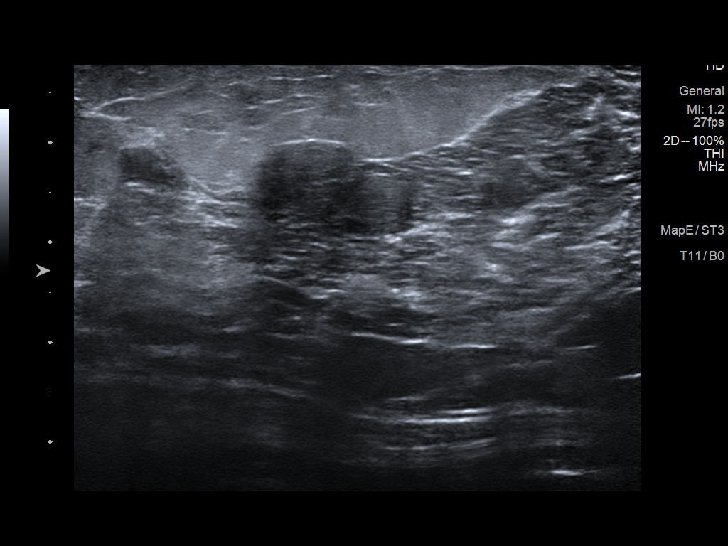
[im 9/18]
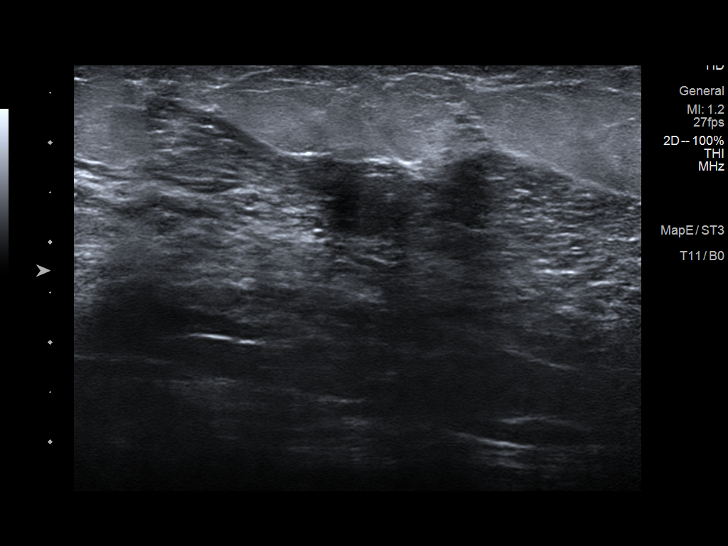
[im 10/18]
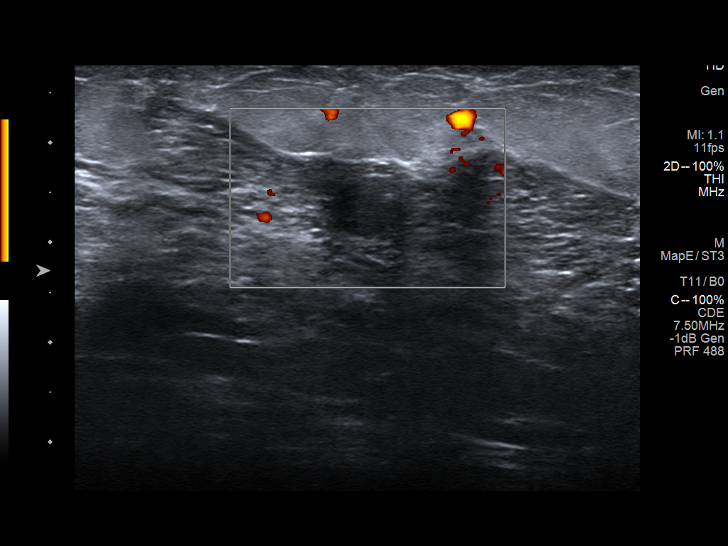
[im 11/18]
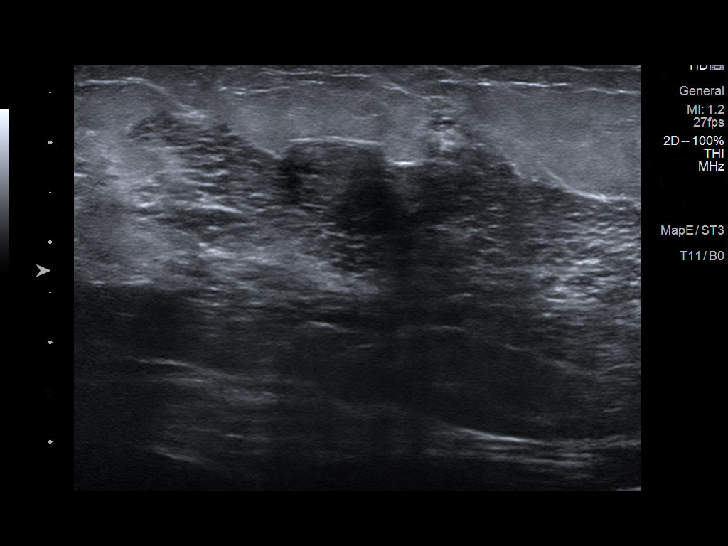
[im 13/18]
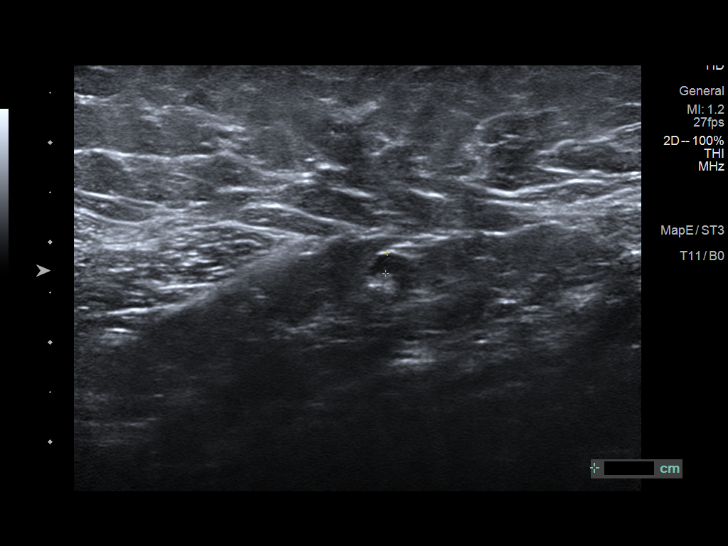
[im 14/18]
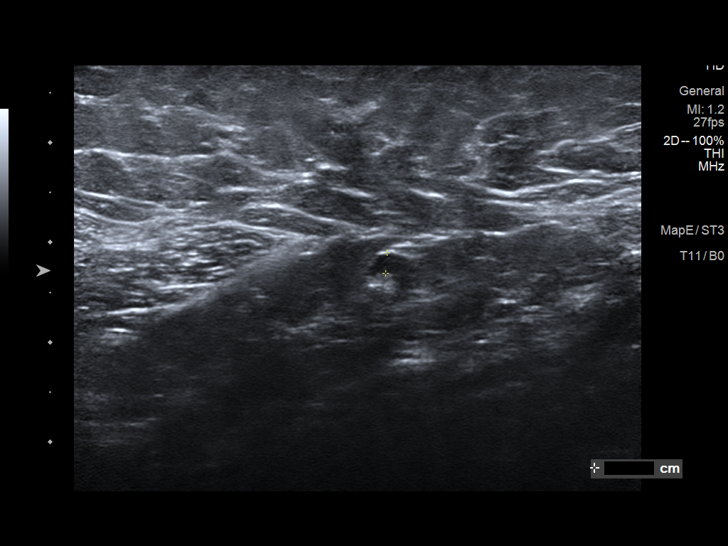
[im 15/18]
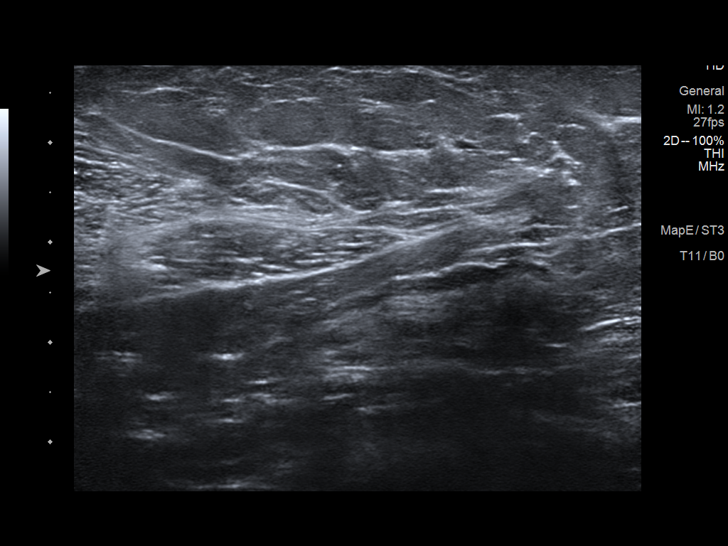
[im 17/18]
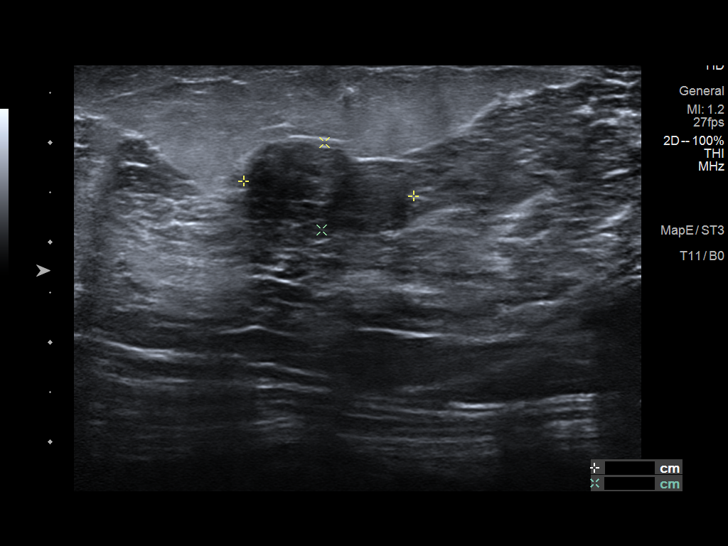
[im 18/18]
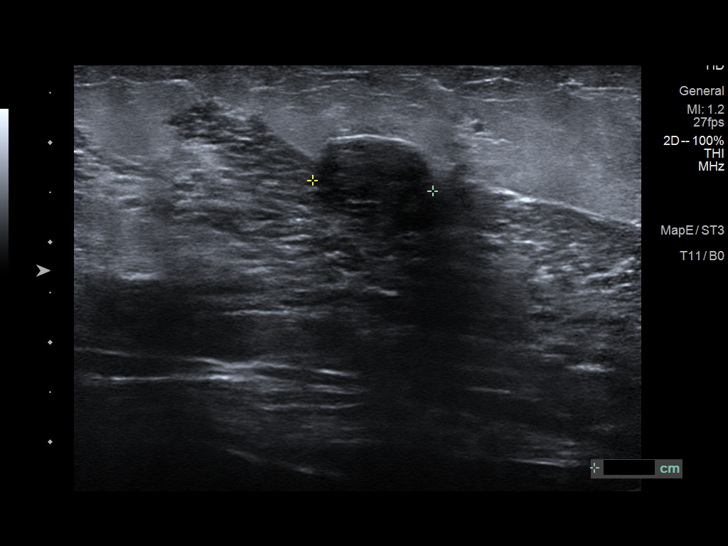

[14 of 18 positions shown; findings below may reference images not displayed]

FINDINGS: On physical exam, a firm palpable mass is identified at the 12
o'clock position of the LEFT breast 3 cm from the nipple.

Targeted ultrasound is performed, showing a 1.7 x 0.9 x 1.2 cm
circumscribed bilobed oval hypoechoic parallel mass at the 12
o'clock position of the LEFT breast 3 cm from the nipple.

No abnormal LEFT axillary lymph nodes are noted.
IMPRESSION: 1.7 cm likely benign mass/probable fibroadenoma in the UPPER LEFT
breast. We discussed management options including ultrasound-guided
core biopsy and close follow-up. Follow-up ultrasound is recommended
at 6, 12, and 24 months to assess stability. The patient concurs
with this plan.

RECOMMENDATION:
LEFT breast ultrasound in 6 months.

I have discussed the findings and recommendations with the patient.
If applicable, a reminder letter will be sent to the patient
regarding the next appointment.

BI-RADS CATEGORY  3: Probably benign.
